# Patient Record
Sex: Male | Born: 1960 | Race: White | Hispanic: No | Marital: Married | Smoking: Never smoker
Health system: Southern US, Community
[De-identification: ages and names within clinical notes are randomized; demographics above are authoritative.]

## PROBLEM LIST (undated history)

## (undated) DIAGNOSIS — E785 Hyperlipidemia, unspecified: Secondary | ICD-10-CM

## (undated) HISTORY — DX: Hyperlipidemia, unspecified: E78.5

---
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
## 2008-01-17 ENCOUNTER — Ambulatory Visit: Payer: Self-pay | Admitting: Hematology & Oncology

## 2008-01-24 LAB — CBC WITH DIFFERENTIAL (CANCER CENTER ONLY)
BASO#: 0.1 10*3/uL (ref 0.0–0.2)
BASO%: 0.9 % (ref 0.0–2.0)
Eosinophils Absolute: 0.1 10*3/uL (ref 0.0–0.5)
HGB: 16.4 g/dL (ref 13.0–17.1)
LYMPH#: 2.7 10*3/uL (ref 0.9–3.3)
MCH: 33.8 pg — ABNORMAL HIGH (ref 28.0–33.4)
MCHC: 36 g/dL — ABNORMAL HIGH (ref 32.0–35.9)
MONO#: 0.5 10*3/uL (ref 0.1–0.9)
Platelets: 471 10*3/uL — ABNORMAL HIGH (ref 145–400)
RDW: 11.7 % (ref 10.5–14.6)
WBC: 7.1 10*3/uL (ref 4.0–10.0)

## 2008-01-25 LAB — COMPREHENSIVE METABOLIC PANEL
ALT: 19 U/L (ref 0–53)
AST: 20 U/L (ref 0–37)
Alkaline Phosphatase: 77 U/L (ref 39–117)
BUN: 13 mg/dL (ref 6–23)
CO2: 29 mEq/L (ref 19–32)
Calcium: 9.6 mg/dL (ref 8.4–10.5)
Chloride: 100 mEq/L (ref 96–112)
Creatinine, Ser: 0.97 mg/dL (ref 0.40–1.50)
Potassium: 4.4 mEq/L (ref 3.5–5.3)

## 2008-01-25 LAB — TSH: TSH: 1.14 u[IU]/mL (ref 0.350–4.500)

## 2008-01-31 ENCOUNTER — Encounter: Admission: RE | Admit: 2008-01-31 | Discharge: 2008-01-31 | Payer: Self-pay | Admitting: Hematology & Oncology

## 2008-02-22 ENCOUNTER — Encounter: Admission: RE | Admit: 2008-02-22 | Discharge: 2008-02-22 | Payer: Self-pay | Admitting: General Surgery

## 2008-02-23 ENCOUNTER — Ambulatory Visit (HOSPITAL_BASED_OUTPATIENT_CLINIC_OR_DEPARTMENT_OTHER): Admission: RE | Admit: 2008-02-23 | Discharge: 2008-02-23 | Payer: Self-pay | Admitting: General Surgery

## 2008-02-23 ENCOUNTER — Encounter (INDEPENDENT_AMBULATORY_CARE_PROVIDER_SITE_OTHER): Payer: Self-pay | Admitting: General Surgery

## 2008-03-09 HISTORY — PX: LYMPH NODE BIOPSY: SHX201

## 2009-07-23 ENCOUNTER — Observation Stay (HOSPITAL_COMMUNITY): Admission: EM | Admit: 2009-07-23 | Discharge: 2009-07-23 | Payer: Self-pay | Admitting: Emergency Medicine

## 2009-07-23 ENCOUNTER — Ambulatory Visit: Payer: Self-pay | Admitting: Internal Medicine

## 2009-07-23 ENCOUNTER — Encounter (INDEPENDENT_AMBULATORY_CARE_PROVIDER_SITE_OTHER): Payer: Self-pay | Admitting: Emergency Medicine

## 2009-07-30 ENCOUNTER — Ambulatory Visit (HOSPITAL_COMMUNITY): Admission: RE | Admit: 2009-07-30 | Discharge: 2009-07-30 | Payer: Self-pay | Admitting: Cardiovascular Disease

## 2009-09-10 ENCOUNTER — Encounter: Admission: RE | Admit: 2009-09-10 | Discharge: 2009-09-10 | Payer: Self-pay | Admitting: Cardiovascular Disease

## 2009-09-13 ENCOUNTER — Ambulatory Visit (HOSPITAL_COMMUNITY): Admission: RE | Admit: 2009-09-13 | Discharge: 2009-09-13 | Payer: Self-pay | Admitting: Cardiovascular Disease

## 2010-05-02 ENCOUNTER — Encounter (INDEPENDENT_AMBULATORY_CARE_PROVIDER_SITE_OTHER): Payer: Self-pay | Admitting: *Deleted

## 2010-05-26 LAB — BASIC METABOLIC PANEL
CO2: 30 mEq/L (ref 19–32)
Calcium: 8.9 mg/dL (ref 8.4–10.5)
Chloride: 103 mEq/L (ref 96–112)
Creatinine, Ser: 1.09 mg/dL (ref 0.4–1.5)
GFR calc Af Amer: >60 mL/min (ref >60)
Glucose, Bld: 106 mg/dL — ABNORMAL HIGH (ref 70–99)
Potassium: 3.9 mEq/L (ref 3.5–5.1)

## 2010-05-26 LAB — POCT CARDIAC MARKERS
CKMB, poc: <1 ng/mL — ABNORMAL LOW (ref 1.0–8.0)
CKMB, poc: <1 ng/mL — ABNORMAL LOW (ref 1.0–8.0)
CKMB, poc: <1 ng/mL — ABNORMAL LOW (ref 1.0–8.0)
Myoglobin, poc: 32.2 ng/mL (ref 12–200)
Myoglobin, poc: 36.2 ng/mL (ref 12–200)
Troponin i, poc: <0.05 ng/mL (ref 0.00–0.09)
Troponin i, poc: <0.05 ng/mL (ref 0.00–0.09)
Troponin i, poc: <0.05 ng/mL (ref 0.00–0.09)

## 2010-05-26 LAB — CBC

## 2010-05-26 LAB — DIFFERENTIAL
Basophils Absolute: 0.1 10*3/uL (ref 0.0–0.1)
Basophils Relative: 1 % (ref 0–1)
Monocytes Relative: 8 % (ref 3–12)

## 2010-07-22 NOTE — Op Note (Signed)
Dylan Guzman, Dylan Guzman              ACCOUNT NO.:  [id number]   MEDICAL RECORD NO.:  [id number]          PATIENT TYPE:  AMB   LOCATION:  DSC                          FACILITY:  [facility]   PHYSICIAN:  Adolph Pollack, M.D.DATE OF BIRTH:  12-21-1960   DATE OF PROCEDURE:  DATE OF DISCHARGE:                               OPERATIVE REPORT   PREOPERATIVE DIAGNOSIS:  Left axillary lymphadenopathy.   POSTOPERATIVE DIAGNOSIS:  Left axillary lymphadenopathy.   PROCEDURE:  Deep left axillary lymph node biopsy.   SURGEON:  Adolph Pollack, MD   ANESTHESIA:  General plus local (mixture of Marcaine, lidocaine and  sodium bicarbonate).   INDICATION:  This is a 50 year old male with persistent left axillary  adenopathy cause unknown.  He now presents for a left axillary lymph  node biopsy.  Procedure risks were discussed with him preoperatively.   TECHNIQUE:  He was seen in the holding area and the left shoulder and  axillary area marked with my initials.  He was then brought to the  operating room, placed supine on the operating table, and given  intravenous sedation.  The hair in left axilla was clipped and the area  sterilely prepped and draped.  Local anesthetic was infiltrated at the  inferior axillary line, superficially and deep.  A curvilinear incision  was made at the inferior axillary hairline incising the skin and  subcutaneous tissue.  I then went down through some of the fascia and  identified a lymph node measuring 2-2.5 cm in size.  I divided the  lymphatics with electrocautery and mobilized the lymph lobe bluntly with  cautery and then removed it.  I sent it fresh to pathology.   Following this, I inspected the wound and bleeding was controlled with  electrocautery.   Once hemostasis was adequate, the wound was closed in two layers.  The  deep layer of subcutaneous tissue was closed with a running 3-0 Vicryl  suture.  The skin was closed with a running 4-0 Monocryl  subcuticular  stitch.  Steri-Strips and sterile dressing were applied.   He tolerated the procedure without any apparent complications and taken  to the recovery room in satisfactory condition.      Adolph Pollack, M.D.  Electronically Signed     TJR/MEDQ  D:  02/23/2008  T:  02/24/2008  Job:  [id number]   cc:   Rose Phi. Myna Hidalgo, M.D.  Kari Baars, M.D.

## 2010-09-01 NOTE — Letter (Signed)
Summary: Referral - not able to see patient  [name]  [address] [place], [place] [address]   Phone: [phone]  Fax: [phone]        May 02, 2010   Vilinda Blanks. Clelia Croft M.D. [facility] [address] [place], [place] [address]    Re:   Dylan Guzman DOB:  [birth year] MRN:   [id number]    Dear Dr. Clelia Croft:  Thank you for your kind referral of the above patient.  We have attempted to schedule the recommended procedure Screening Colonoscopy but have not been able to schedule because:   X  The patient was not available by phone and/or has not returned our calls.  ___ The patient declined to schedule the procedure at this time.  We appreciate the referral and hope that we will have the opportunity to treat this patient in the future.    Sincerely,    [facility] [phone]

## 2011-07-21 ENCOUNTER — Encounter: Payer: Self-pay | Admitting: Internal Medicine

## 2011-08-06 IMAGING — CR DG CHEST 2V
2 series · 2 of 2 positions shown · non-contrast
Comparison: 02/22/2008

CLINICAL DATA: Chest pain

CHEST - 2 VIEW

[w chest pa]
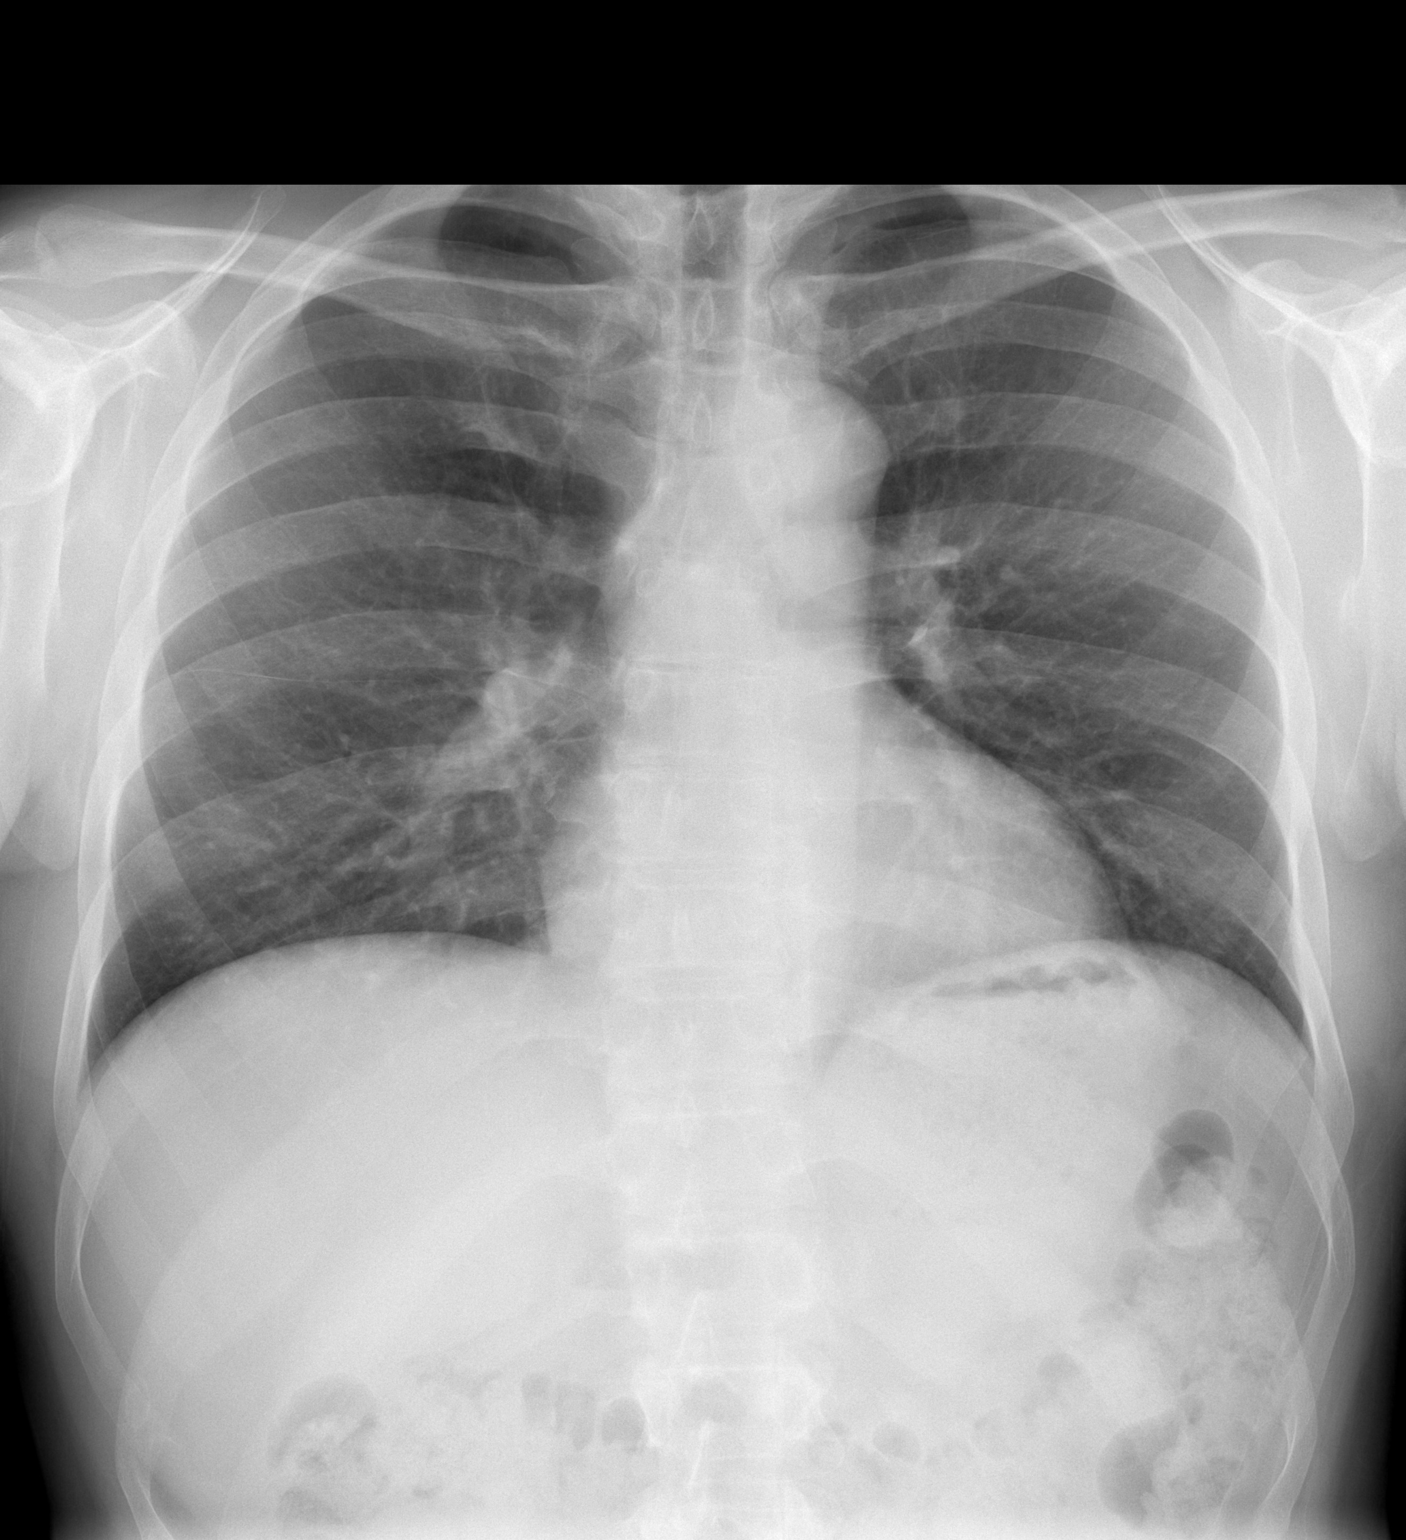

[w chest lat]
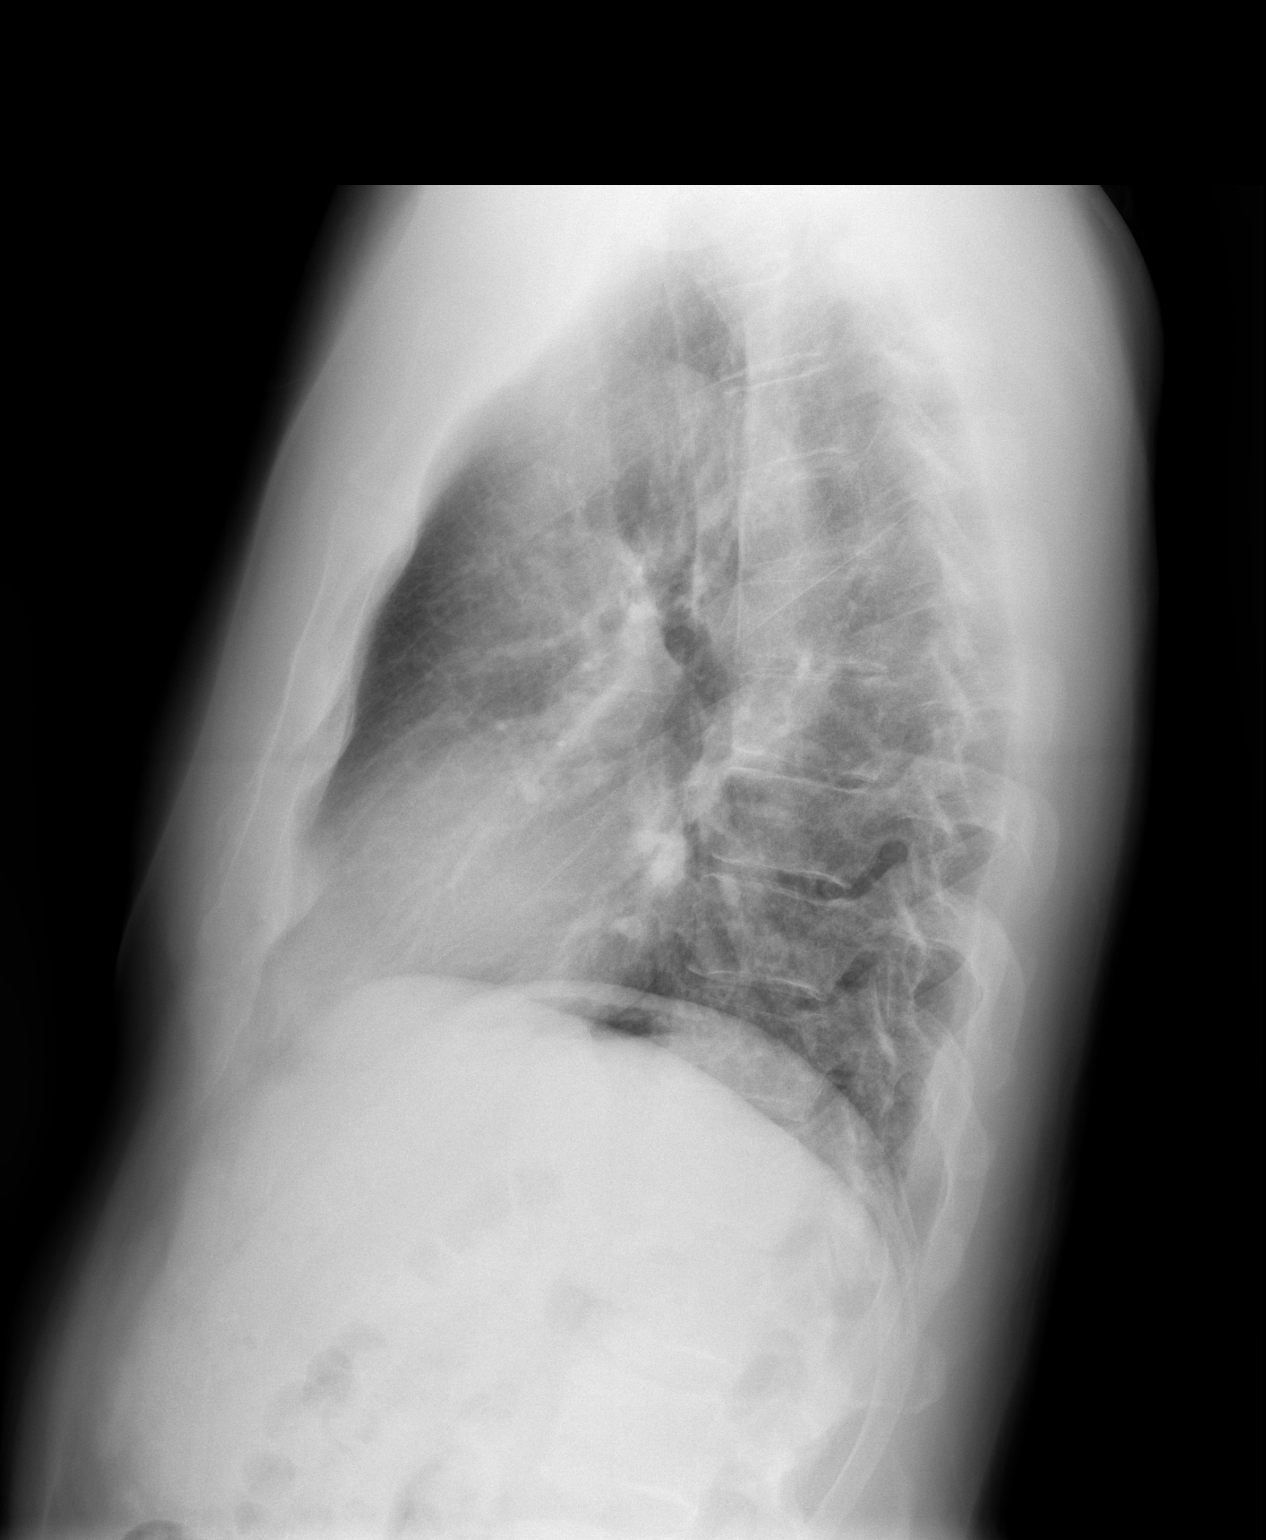

[2 of 2 positions shown; findings below may reference images not displayed]

FINDINGS: Upper normal heart size.
Normal mediastinal contours and pulmonary vascularity.
Lungs clear.
No pleural effusion or pneumothorax.
Bones unremarkable.
IMPRESSION: No acute abnormalities.

## 2011-08-20 ENCOUNTER — Encounter: Payer: Self-pay | Admitting: Internal Medicine

## 2011-08-20 ENCOUNTER — Ambulatory Visit (AMBULATORY_SURGERY_CENTER): Payer: 59 | Admitting: *Deleted

## 2011-08-20 VITALS — Ht 70.0 in | Wt 183.0 lb

## 2011-08-20 DIAGNOSIS — Z1211 Encounter for screening for malignant neoplasm of colon: Secondary | ICD-10-CM

## 2011-08-20 MED ORDER — MOVIPREP 100 G PO SOLR: ORAL | Status: DC

## 2011-08-21 ENCOUNTER — Other Ambulatory Visit: Payer: Self-pay

## 2011-08-21 DIAGNOSIS — Z1211 Encounter for screening for malignant neoplasm of colon: Secondary | ICD-10-CM

## 2011-08-21 MED ORDER — MOVIPREP 100 G PO SOLR: ORAL | Status: DC

## 2011-09-03 ENCOUNTER — Ambulatory Visit (AMBULATORY_SURGERY_CENTER): Payer: 59 | Admitting: Internal Medicine

## 2011-09-03 ENCOUNTER — Encounter: Payer: Self-pay | Admitting: Internal Medicine

## 2011-09-03 VITALS — BP 131/79 | HR 95 | Temp 96.0°F | Resp 13 | Ht 70.0 in | Wt 183.0 lb

## 2011-09-03 DIAGNOSIS — Z1211 Encounter for screening for malignant neoplasm of colon: Secondary | ICD-10-CM

## 2011-09-03 MED ORDER — SODIUM CHLORIDE 0.9 % IV SOLN: 500.0000 mL | INTRAVENOUS | Status: DC

## 2011-09-03 NOTE — Op Note (Signed)
[facility] [address] [place], [place]  [address]  COLONOSCOPY PROCEDURE REPORT  PATIENT:  Dylan Guzman, Dylan Guzman  MR#:  [id number] BIRTHDATE:  1960/08/04, 51 yrs. old  GENDER:  male ENDOSCOPIST:  Wilhemina Bonito. Eda Keys, MD REF. BY:  Kari Baars, M.D. PROCEDURE DATE:  09/03/2011 PROCEDURE:  Average-risk screening colonoscopy G0121 ASA CLASS:  Class I INDICATIONS:  Routine Risk Screening MEDICATIONS:   MAC sedation, administered by CRNA, propofol (Diprivan) 200 mg IV  DESCRIPTION OF PROCEDURE:   After the risks benefits and alternatives of the procedure were thoroughly explained, informed consent was obtained.  Digital rectal exam was performed and revealed no abnormalities.   The LB CF-[id number] endoscope was introduced through the anus and advanced to the cecum, which was identified by both the appendix and ileocecal valve, without limitations.  The quality of the prep was excellent, using MoviPrep.  The instrument was then slowly withdrawn as the colon was fully examined. <<PROCEDUREIMAGES>>  FINDINGS:  Mild diverticulosis was found in the sigmoid colon. Otherwise normal colonoscopy without  polyps, masses, vascular ectasias, or inflammatory changes.   Retroflexed views in the rectum revealed no abnormalities.    The time to cecum = 2:01 minutes. The scope was then withdrawn in   8:44  minutes from the cecum and the procedure completed.  COMPLICATIONS:  None  ENDOSCOPIC IMPRESSION: 1) Mild diverticulosis in the sigmoid colon 2) Otherwise normal colonoscopy  RECOMMENDATIONS: 1) Continue current colorectal screening recommendations for "routine risk" patients with a repeat colonoscopy in 10 years.  ______________________________ Wilhemina Bonito. Eda Keys, MD  CC:  Kari Baars, MD;  The Patient  n. eSIGNED:   Wilhemina Bonito. Eda Keys at 09/03/2011 11:30 AM  Marko Plume, [phone]

## 2011-09-03 NOTE — Progress Notes (Signed)
Patient did not experience any of the following events: a burn prior to discharge; a fall within the facility; wrong site/side/patient/procedure/implant event; or a hospital transfer or hospital admission upon discharge from the facility. (G8907) Patient did not have preoperative order for IV antibiotic SSI prophylaxis. (G8918)  

## 2011-09-03 NOTE — Progress Notes (Signed)
Pressure applied to abdomen to reach cecum.  

## 2011-09-03 NOTE — Patient Instructions (Addendum)

## 2011-09-04 ENCOUNTER — Telehealth: Payer: Self-pay | Admitting: *Deleted

## 2011-09-04 NOTE — Telephone Encounter (Signed)
  Follow up Call-  Call back number 09/03/2011  Post procedure Call Back phone  # [phone]  Permission to leave phone message Yes     Patient questions:  Do you have a fever, pain , or abdominal swelling? yes Pain Score  1 *  Have you tolerated food without any problems? yes  Have you been able to return to your normal activities? yes  Do you have any questions about your discharge instructions: Diet   no Medications  no Follow up visit  no  Do you have questions or concerns about your Care? no  Actions: * If pain score is 4 or above: No action needed, pain <4.   C/O some left sided abdominal pain, when he "bends over."  Rates as a "1."

## 2014-07-21 ENCOUNTER — Emergency Department (HOSPITAL_COMMUNITY)
Admission: EM | Admit: 2014-07-21 | Discharge: 2014-07-21 | Disposition: A | Discharge status: Home / Self Care | Payer: No Typology Code available for payment source | Attending: Emergency Medicine | Admitting: Emergency Medicine

## 2014-07-21 ENCOUNTER — Emergency Department (HOSPITAL_COMMUNITY): Payer: No Typology Code available for payment source

## 2014-07-21 ENCOUNTER — Encounter (HOSPITAL_COMMUNITY): Payer: Self-pay | Admitting: Emergency Medicine

## 2014-07-21 DIAGNOSIS — S3991XA Unspecified injury of abdomen, initial encounter: Secondary | ICD-10-CM | POA: Diagnosis present

## 2014-07-21 DIAGNOSIS — Y92410 Unspecified street and highway as the place of occurrence of the external cause: Secondary | ICD-10-CM | POA: Insufficient documentation

## 2014-07-21 DIAGNOSIS — Z8639 Personal history of other endocrine, nutritional and metabolic disease: Secondary | ICD-10-CM | POA: Insufficient documentation

## 2014-07-21 DIAGNOSIS — Y998 Other external cause status: Secondary | ICD-10-CM | POA: Insufficient documentation

## 2014-07-21 DIAGNOSIS — Y9389 Activity, other specified: Secondary | ICD-10-CM | POA: Insufficient documentation

## 2014-07-21 DIAGNOSIS — R1031 Right lower quadrant pain: Secondary | ICD-10-CM

## 2014-07-21 MED ORDER — IOHEXOL 300 MG/ML  SOLN
100.0000 mL | Freq: Once | INTRAMUSCULAR | Status: AC | PRN
  Administered 2014-07-21: 100 mL via INTRAVENOUS

## 2014-07-21 NOTE — ED Notes (Signed)
Pt placed in gown and in bed. Pt monitored by pulse ox, bp cuff, and 12-lead. 

## 2014-07-21 NOTE — Discharge Instructions (Signed)
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
## 2018-10-13 ENCOUNTER — Other Ambulatory Visit: Payer: Self-pay | Admitting: Internal Medicine

## 2018-10-13 DIAGNOSIS — E785 Hyperlipidemia, unspecified: Secondary | ICD-10-CM

## 2018-10-26 ENCOUNTER — Ambulatory Visit
Admission: RE | Admit: 2018-10-26 | Discharge: 2018-10-26 | Disposition: A | Discharge status: Home / Self Care | Payer: Self-pay | Source: Ambulatory Visit | Attending: Internal Medicine | Admitting: Internal Medicine

## 2018-10-26 DIAGNOSIS — E785 Hyperlipidemia, unspecified: Secondary | ICD-10-CM

## 2020-11-09 IMAGING — CT CT HEART SCORING
3 series · 14 of 20 positions shown, 16 images · non-contrast
Comparison: None.

CLINICAL DATA: Hyperlipidemia

EXAM:
CT HEART FOR CALCIUM SCORING
TECHNIQUE: CT heart was performed using prospective ECG gating.
A non-contrast exam for calcium scoring was performed.
Note that this exam targets the heart and the chest was not imaged
in its entirety.

[Series 2: calcium scoring 2.00 qr36 bestdiast 70% · axial · 0.35mm/px · z∈[+1663,+1745]mm · 4 of 69 slices shown]
[im 14/69  vessel]
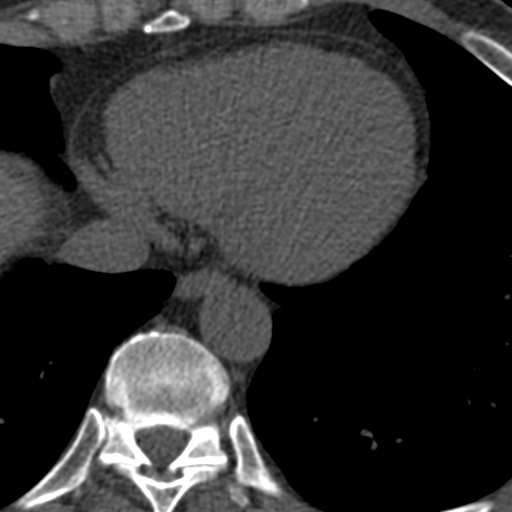
[im 28/69  vessel]
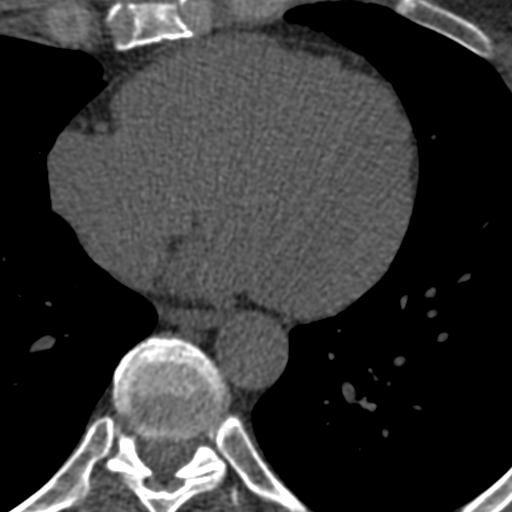
[im 41/69  vessel]
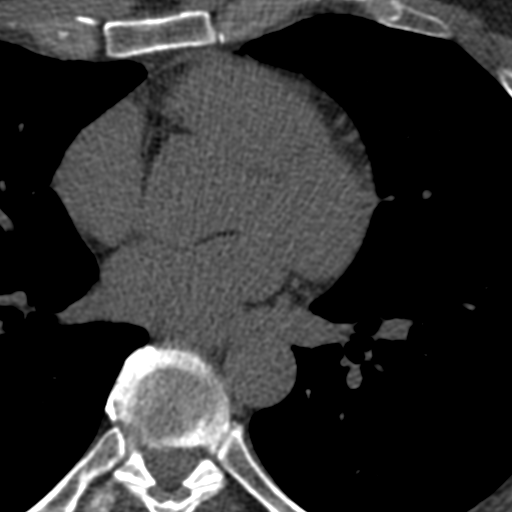
[im 55/69  vessel]
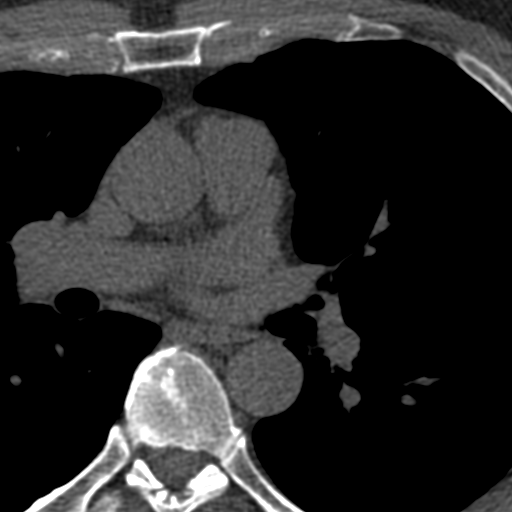

[Series 3: calcium scoring 2.00 br40 bestdiast 70% fov · axial · 0.60mm/px · z∈[+1658,+1750]mm · 5 of 70 slices shown, 7 images]
[im 12/70  vessel]
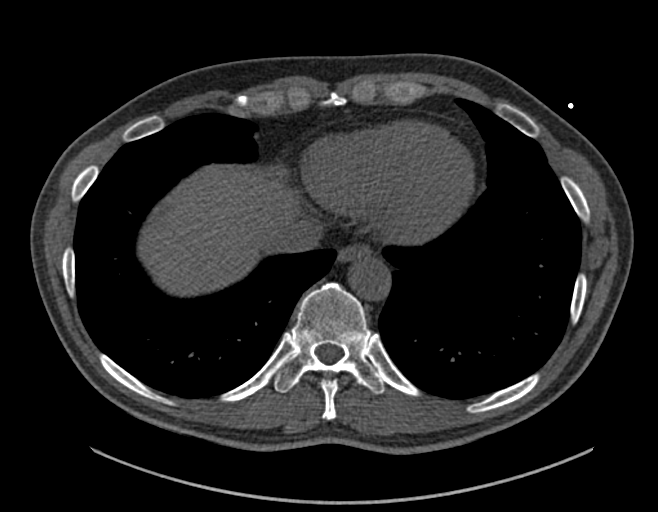
[im 12/70  lung]
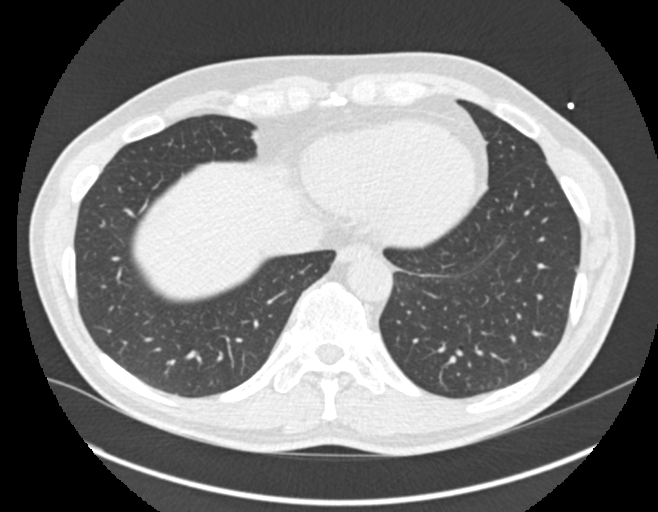
[im 24/70  vessel]
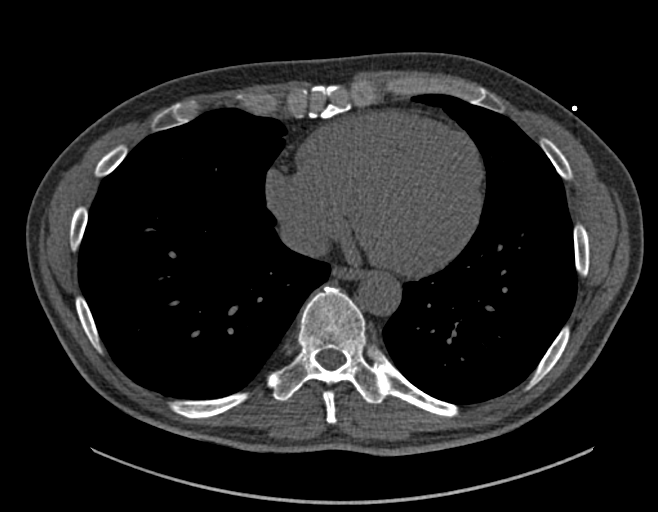
[im 35/70  vessel]
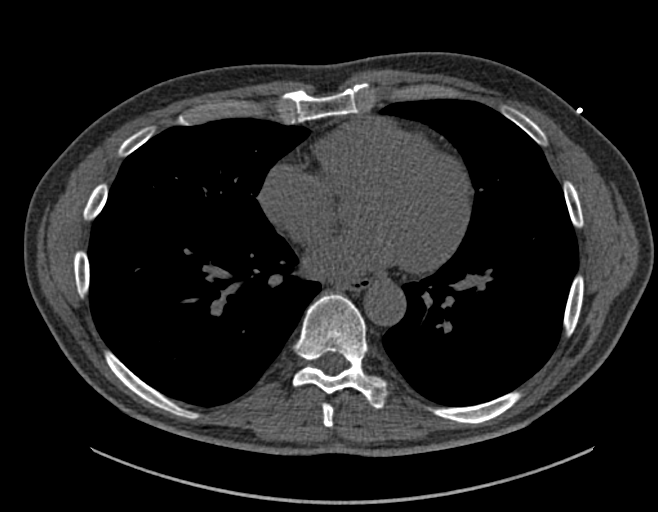
[im 47/70  vessel]
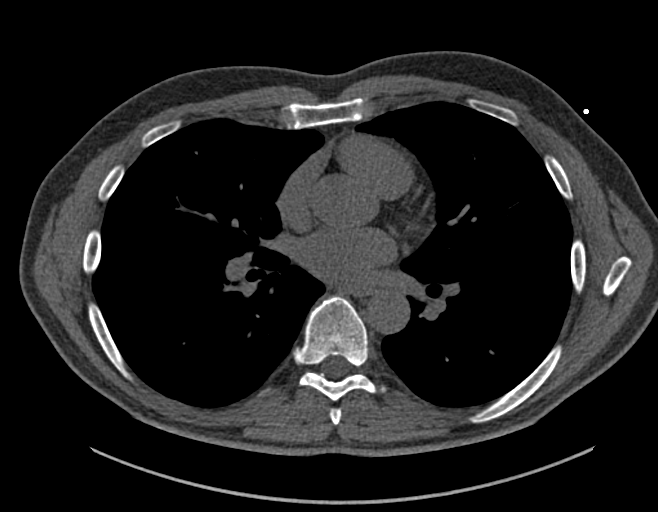
[im 58/70  vessel]
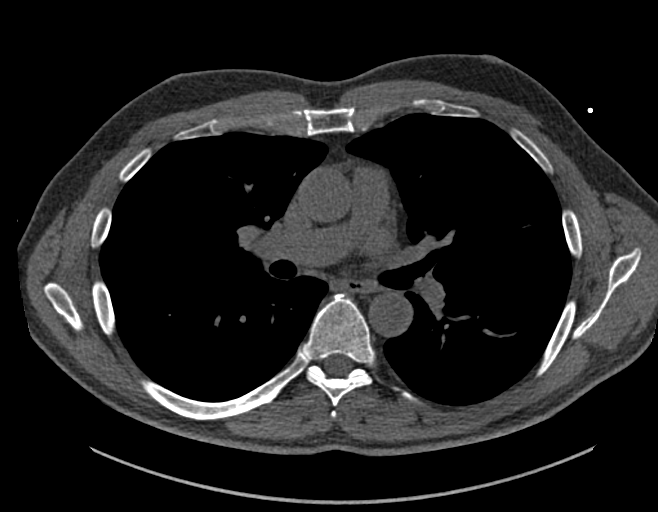
[im 58/70  lung]
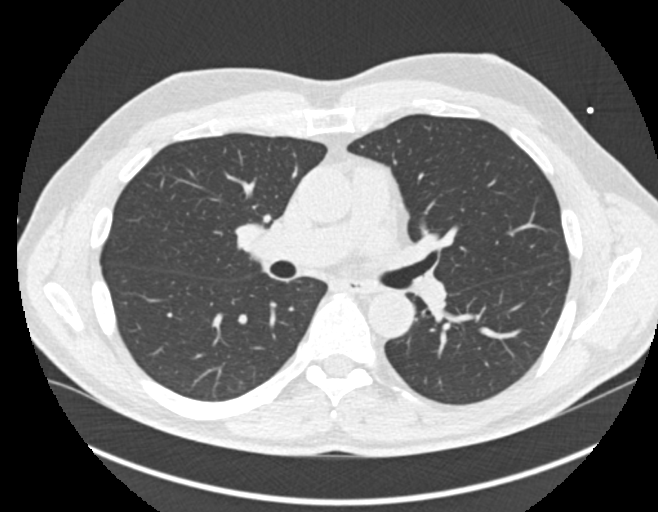

[Series 9: calcium scoring 2.00 br60 bestdiast 70% fov · axial · 0.61mm/px · z∈[+1659,+1749]mm · 5 of 69 slices shown]
[im 12/69  vessel]
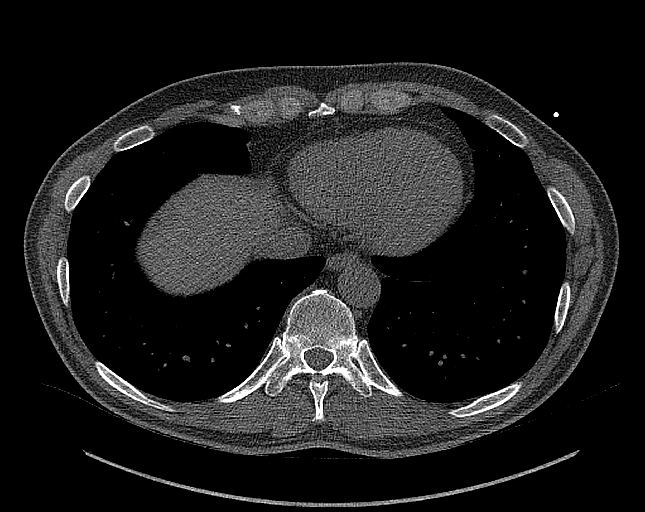
[im 23/69  vessel]
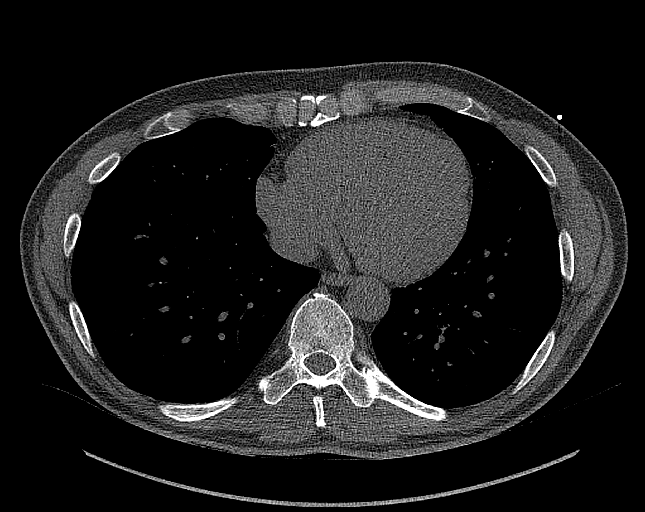
[im 35/69  vessel]
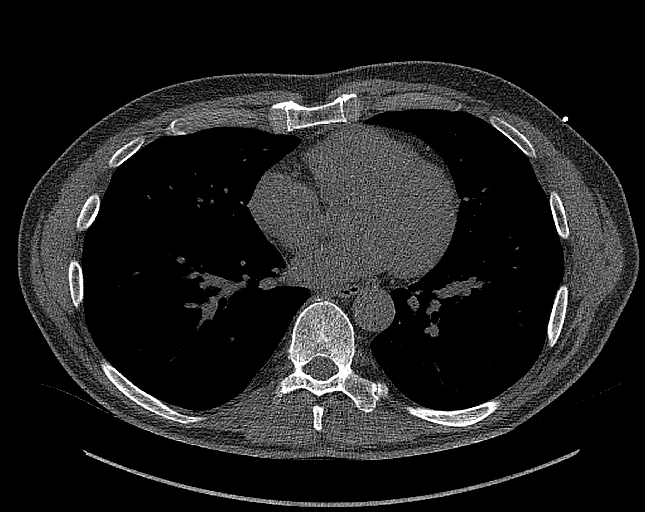
[im 46/69  vessel]
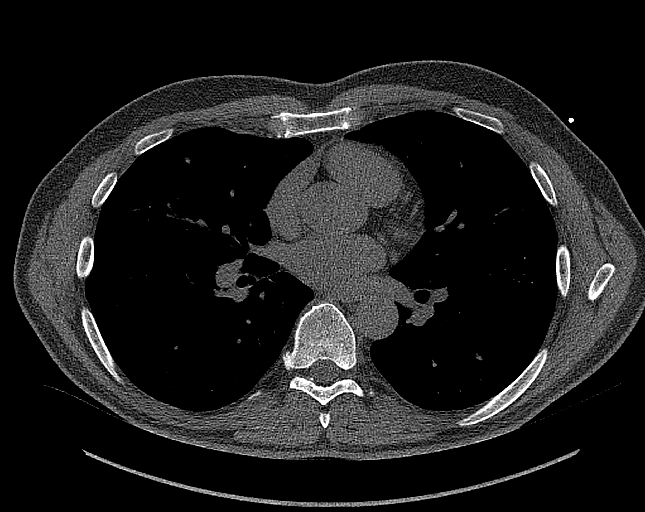
[im 57/69  vessel]
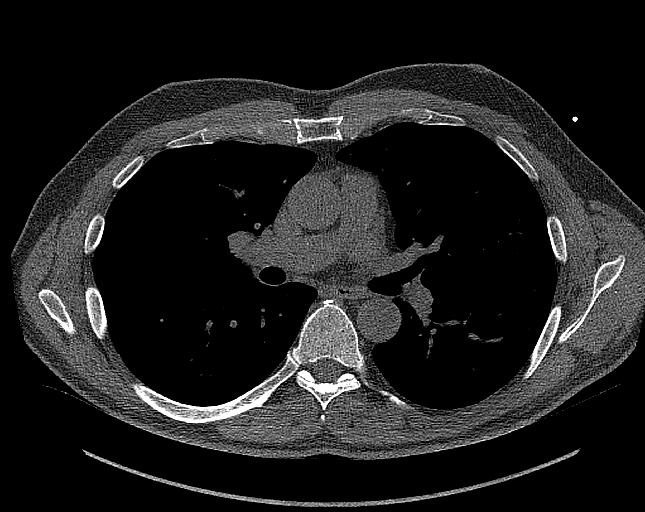

[14 of 20 positions shown; findings below may reference images not displayed]

FINDINGS: Technical quality: Good.

CORONARY CALCIUM

Total Agatston Score: 0

[HOSPITAL] percentile:  0

OTHER FINDINGS:

Cardiovascular: Heart is normal size. Visualized aorta is normal
caliber.

Mediastinum/Nodes: No adenopathy in the lower mediastinum or hila.

Lungs/Pleura: 5 mm nodule in the right middle lobe along the minor
fissure. Visualized lungs otherwise clear. No effusions.

Upper Abdomen: Imaging into the upper abdomen shows no acute
findings.

Musculoskeletal: Chest wall soft tissues are unremarkable. No acute
bony abnormality.
IMPRESSION: The observed calcium score of 0 is at the percentile 0 for subjects
of the same age, gender and race/ethnicity who are free of clinical
cardiovascular disease and treated diabetes.

5 mm right middle lobe nodule. No follow-up needed if patient is
low-risk. Non-contrast chest CT can be considered in 12 months if
patient is high-risk. This recommendation follows the consensus
statement: Guidelines for Management of Incidental Pulmonary Nodules
Detected on CT Images: From the [HOSPITAL] 9888; Radiology

## 2021-06-24 ENCOUNTER — Encounter: Payer: Self-pay | Admitting: Emergency Medicine

## 2021-06-24 ENCOUNTER — Ambulatory Visit
Admission: EM | Admit: 2021-06-24 | Discharge: 2021-06-24 | Disposition: A | Discharge status: Home / Self Care | Payer: No Typology Code available for payment source | Attending: Internal Medicine | Admitting: Internal Medicine

## 2021-06-24 DIAGNOSIS — H6121 Impacted cerumen, right ear: Secondary | ICD-10-CM

## 2021-06-24 NOTE — ED Triage Notes (Signed)
Patient states that he feels like his right ear has water in it and having ringing in his ear x 1 week.  Decreased hearing as well. ?

## 2021-06-24 NOTE — Discharge Instructions (Signed)
Your ear has been washed out.  Please follow-up if symptoms persist or worsen. ?

## 2021-06-24 NOTE — ED Provider Notes (Signed)
?[facility] ? ? ? ?CSN: [id number] ?Arrival date & time: 06/24/21  0935 ? ? ?  ? ?History   ?Chief Complaint ?Chief Complaint  ?Patient presents with  ? Otalgia  ? ? ?HPI ?Dylan Guzman is a 61 y.o. male.  ? ?Patient presents with right ear feelings of ear fullness and discomfort that has been present for approximately 1 week.  Also reports "ringing in the ear".  Denies any associated upper respiratory symptoms, cough, fever.  Denies trauma, foreign body, decreased hearing, foreign body in the ear. ? ? ?Otalgia ? ?Past Medical History:  ?Diagnosis Date  ? Hyperlipidemia   ? ? ?There are no problems to display for this patient. ? ? ?Past Surgical History:  ?Procedure Laterality Date  ? LYMPH NODE BIOPSY  2010  ? benign  ? SPLENECTOMY, TOTAL  1980  ? ? ? ? ? ?Home Medications   ? ?Prior to Admission medications   ?Not on File  ? ? ?Family History ?Family History  ?Problem Relation Age of Onset  ? Colon cancer Neg Hx   ? ? ?Social History ?Social History  ? ?Tobacco Use  ? Smoking status: Never  ? Smokeless tobacco: Never  ?Substance Use Topics  ? Alcohol use: No  ? Drug use: No  ? ? ? ?Allergies   ?Statins ? ? ?Review of Systems ?Review of Systems ?Per HPI ? ?Physical Exam ?Triage Vital Signs ?ED Triage Vitals [06/24/21 1053]  ?Enc Vitals Group  ?   BP 136/81  ?   Pulse Rate 62  ?   Resp 18  ?   Temp 98.1 ?F (36.7 ?C)  ?   Temp Source Oral  ?   SpO2 96 %  ?   Weight 182 lb 15.7 oz (83 kg)  ?   Height 5\' 10"  (1.778 m)  ?   Head Circumference   ?   Peak Flow   ?   Pain Score 0  ?   Pain Loc   ?   Pain Edu?   ?   Excl. in [place]?   ? ?No data found. ? ?Updated Vital Signs ?BP 136/81 (BP Location: Left Arm)   Pulse 62   Temp 98.1 ?F (36.7 ?C) (Oral)   Resp 18   Ht 5\' 10"  (1.778 m)   Wt 182 lb 15.7 oz (83 kg)   SpO2 96%   BMI 26.26 kg/m?  ? ?Visual Acuity ?Right Eye Distance:   ?Left Eye Distance:   ?Bilateral Distance:   ? ?Right Eye Near:   ?Left Eye Near:    ?Bilateral Near:    ? ?Physical  Exam ?Constitutional:   ?   General: He is not in acute distress. ?   Appearance: Normal appearance. He is not toxic-appearing or diaphoretic.  ?HENT:  ?   Head: Normocephalic and atraumatic.  ?   Right Ear: No drainage, swelling or tenderness. No middle ear effusion. There is impacted cerumen. No mastoid tenderness. Tympanic membrane is not perforated, erythematous or bulging.  ?   Ears:  ?   Comments: Impacted cerumen of right external canal on original exam.  Ear irrigation completed to right ear with successful removal of cerumen.  Tympanic membrane and external canal appear normal. ?Eyes:  ?   Extraocular Movements: Extraocular movements intact.  ?   Conjunctiva/sclera: Conjunctivae normal.  ?Pulmonary:  ?   Effort: Pulmonary effort is normal.  ?Neurological:  ?   General: No focal deficit present.  ?  Mental Status: He is alert and oriented to person, place, and time. Mental status is at baseline.  ?Psychiatric:     ?   Mood and Affect: Mood normal.     ?   Behavior: Behavior normal.     ?   Thought Content: Thought content normal.     ?   Judgment: Judgment normal.  ? ? ? ?UC Treatments / Results  ?Labs ?(all labs ordered are listed, but only abnormal results are displayed) ?Labs Reviewed - No data to display ? ?EKG ? ? ?Radiology ?No results found. ? ?Procedures ?Procedures (including critical care time) ? ?Medications Ordered in UC ?Medications - No data to display ? ?Initial Impression / Assessment and Plan / UC Course  ?I have reviewed the triage vital signs and the nursing notes. ? ?Pertinent labs & imaging results that were available during my care of the patient were reviewed by me and considered in my medical decision making (see chart for details). ? ?  ? ?Ear irrigation completed to right ear for right impacted cerumen with successful removal of cerumen.  Tympanic membrane and external canal appear normal on exam.  Discussed return precautions.  Patient verbalized understanding and was agreeable  with plan. ?Final Clinical Impressions(s) / UC Diagnoses  ? ?Final diagnoses:  ?Impacted cerumen of right ear  ? ? ? ?Discharge Instructions   ? ?  ?Your ear has been washed out.  Please follow-up if symptoms persist or worsen. ? ? ? ? ?ED Prescriptions   ?None ?  ? ?PDMP not reviewed this encounter. ?  ?Teodora Medici, [place] ?06/24/21 1139 ? ?

## 2021-09-25 ENCOUNTER — Encounter: Payer: Self-pay | Admitting: Internal Medicine
